# Patient Record
Sex: Female | Born: 1995 | Race: Black or African American | Hispanic: No | Marital: Single | Smoking: Never smoker
Health system: Southern US, Community
[De-identification: ages and names within clinical notes are randomized; demographics above are authoritative.]

## PROBLEM LIST (undated history)

## (undated) DIAGNOSIS — F419 Anxiety disorder, unspecified: Secondary | ICD-10-CM

## (undated) HISTORY — DX: Anxiety disorder, unspecified: F41.9

## (undated) HISTORY — PX: KNEE SURGERY: SHX244

---

## 2016-11-27 ENCOUNTER — Ambulatory Visit (INDEPENDENT_AMBULATORY_CARE_PROVIDER_SITE_OTHER): Payer: BLUE CROSS/BLUE SHIELD | Admitting: Student

## 2016-11-27 DIAGNOSIS — N83209 Unspecified ovarian cyst, unspecified side: Secondary | ICD-10-CM | POA: Diagnosis not present

## 2016-11-27 DIAGNOSIS — R102 Pelvic and perineal pain: Secondary | ICD-10-CM

## 2016-11-27 LAB — POCT URINALYSIS DIP (MANUAL ENTRY)
BILIRUBIN UA: NEGATIVE
Bilirubin, UA: NEGATIVE
GLUCOSE UA: NEGATIVE
LEUKOCYTES UA: NEGATIVE
Nitrite, UA: NEGATIVE
PROTEIN UA: NEGATIVE
Spec Grav, UA: 1.02 (ref 1.030–1.035)
Urobilinogen, UA: 0.2 (ref ?–2.0)
pH, UA: 7 (ref 5.0–8.0)

## 2016-11-27 LAB — POCT URINE PREGNANCY: Preg Test, Ur: NEGATIVE

## 2016-11-27 MED ORDER — HYDROCODONE-ACETAMINOPHEN 5-325 MG PO TABS: 1.0000 | ORAL_TABLET | Freq: Four times a day (QID) | ORAL | 0 refills | Status: DC | PRN

## 2016-11-27 NOTE — Progress Notes (Signed)
   Subjective:    Patient ID: Meredith Price, female    DOB: 04/12/1996, 21 y.o.   MRN: [id number]  HPI Complains of pelvic pain for 2 days.  LMP 11/13/16.  Sexually active with 1 partner, no concern for STD's, not concerned for pregnancy.  Is not concerned for pregnancy.  Has a history of ovarian cysts, has an OBGYN in [place] that was treating her for that last in July.  Feels the same as before.  Having a lot of pain in cycles.  Denies vaginal bleeding, discharge, dysuria.   PMHx - Updated and reviewed.  Contributory factors include: ovarian cysts PSHx - Updated and reviewed.  Contributory factors include:  Negative FHx - Updated and reviewed.  Contributory factors include:  Negative Social Hx - Updated and reviewed. Contributory factors include: [employer] student, from [place] Medications - reviewed   Review of Systems  Constitutional: Negative for chills, fatigue and fever.  Respiratory: Negative for cough, chest tightness and shortness of breath.   Cardiovascular: Negative for chest pain and leg swelling.  Gastrointestinal: Negative for abdominal pain and nausea.  Genitourinary: Positive for pelvic pain. Negative for difficulty urinating, dysuria, enuresis, frequency, hematuria, urgency, vaginal bleeding, vaginal discharge and vaginal pain.  Musculoskeletal: Negative for arthralgias and joint swelling.  Skin: Negative for rash and wound.  Neurological: Positive for dizziness and light-headedness. Negative for numbness.  Psychiatric/Behavioral: Negative for agitation and confusion.  All other systems reviewed and are negative.      Objective:   Physical Exam  Constitutional: She is oriented to person, place, and time. She appears well-developed and well-nourished. No distress.  HENT:  Head: Normocephalic and atraumatic.  Right Ear: External ear normal.  Left Ear: External ear normal.  Neck: Normal range of motion. Neck supple.  Pulmonary/Chest: Effort normal. No respiratory distress.    Genitourinary: Vagina normal and uterus normal. No vaginal discharge found.  Genitourinary Comments: TTP over right ovary, but no mass palpated.  No CMT.    Musculoskeletal: Normal range of motion. She exhibits no edema.  Neurological: She is alert and oriented to person, place, and time.  Skin: Skin is warm. No rash noted. She is not diaphoretic. No erythema.  Psychiatric: She has a normal mood and affect. Her behavior is normal. Judgment and thought content normal.  Nursing note and vitals reviewed.  BP 125/85   Pulse 87   Temp 98.9 F (37.2 C) (Oral)   Resp 18   Ht 5\' 4"  (1.626 m)   Wt 221 lb (100.2 kg)   LMP 11/17/2016   SpO2 100%   BMI 37.93 kg/m         Assessment & Plan:  Pelvic pain Pregnancy test, UA, pelvic ultrasound to check for cysts.  Ovarian cyst Will get ultrasound to check.  Would follow up with OBGYN at home as well if this keeps coming back.  Signed,  Corliss MarcusAlicia Barnes, DO [facility] 5:46 PM

## 2016-11-27 NOTE — Assessment & Plan Note (Addendum)
Pregnancy test, UA, pelvic ultrasound to check for cysts.

## 2016-11-27 NOTE — Assessment & Plan Note (Addendum)
Will get ultrasound to check.  Would follow up with OBGYN at home as well if this keeps coming back.

## 2016-11-27 NOTE — Patient Instructions (Addendum)
  [facility] 3/30 at 3:30    IF you received an x-ray today, you will receive an invoice from [facility]. Please contact [facility] at [phone] with questions or concerns regarding your invoice.   IF you received labwork today, you will receive an invoice from [facility]. Please contact [facility] at [phone] with questions or concerns regarding your invoice.   Our billing staff will not be able to assist you with questions regarding bills from these companies.  You will be contacted with the lab results as soon as they are available. The fastest way to get your results is to activate your My Chart account. Instructions are located on the last page of this paperwork. If you have not heard from us regarding the results in 2 weeks, please contact this office.

## 2016-11-30 ENCOUNTER — Ambulatory Visit (HOSPITAL_COMMUNITY): Payer: BLUE CROSS/BLUE SHIELD

## 2016-12-17 ENCOUNTER — Ambulatory Visit
Admission: RE | Admit: 2016-12-17 | Discharge: 2016-12-17 | Disposition: A | Discharge status: Home / Self Care | Payer: BLUE CROSS/BLUE SHIELD | Source: Ambulatory Visit | Attending: Student | Admitting: Student

## 2016-12-17 DIAGNOSIS — R102 Pelvic and perineal pain: Secondary | ICD-10-CM

## 2016-12-17 DIAGNOSIS — N83209 Unspecified ovarian cyst, unspecified side: Secondary | ICD-10-CM

## 2017-10-09 DIAGNOSIS — J019 Acute sinusitis, unspecified: Secondary | ICD-10-CM | POA: Diagnosis not present

## 2017-10-09 DIAGNOSIS — B9689 Other specified bacterial agents as the cause of diseases classified elsewhere: Secondary | ICD-10-CM | POA: Diagnosis not present

## 2017-10-09 DIAGNOSIS — R05 Cough: Secondary | ICD-10-CM | POA: Diagnosis not present

## 2017-11-04 DIAGNOSIS — Z113 Encounter for screening for infections with a predominantly sexual mode of transmission: Secondary | ICD-10-CM | POA: Diagnosis not present

## 2017-11-04 DIAGNOSIS — Z3009 Encounter for other general counseling and advice on contraception: Secondary | ICD-10-CM | POA: Diagnosis not present

## 2018-01-14 DIAGNOSIS — H60391 Other infective otitis externa, right ear: Secondary | ICD-10-CM | POA: Diagnosis not present

## 2018-03-17 DIAGNOSIS — Z3202 Encounter for pregnancy test, result negative: Secondary | ICD-10-CM | POA: Diagnosis not present

## 2018-03-17 DIAGNOSIS — Z30430 Encounter for insertion of intrauterine contraceptive device: Secondary | ICD-10-CM | POA: Diagnosis not present

## 2018-04-28 DIAGNOSIS — Z6838 Body mass index (BMI) 38.0-38.9, adult: Secondary | ICD-10-CM | POA: Diagnosis not present

## 2018-04-28 DIAGNOSIS — M79672 Pain in left foot: Secondary | ICD-10-CM | POA: Diagnosis not present

## 2018-04-28 DIAGNOSIS — M25561 Pain in right knee: Secondary | ICD-10-CM | POA: Diagnosis not present

## 2018-04-29 DIAGNOSIS — Z30431 Encounter for routine checking of intrauterine contraceptive device: Secondary | ICD-10-CM | POA: Diagnosis not present

## 2018-05-15 DIAGNOSIS — Z3202 Encounter for pregnancy test, result negative: Secondary | ICD-10-CM | POA: Diagnosis not present

## 2018-05-15 DIAGNOSIS — R102 Pelvic and perineal pain: Secondary | ICD-10-CM | POA: Diagnosis not present

## 2018-05-15 DIAGNOSIS — N926 Irregular menstruation, unspecified: Secondary | ICD-10-CM | POA: Diagnosis not present

## 2018-08-18 DIAGNOSIS — B349 Viral infection, unspecified: Secondary | ICD-10-CM | POA: Diagnosis not present

## 2018-08-18 DIAGNOSIS — L239 Allergic contact dermatitis, unspecified cause: Secondary | ICD-10-CM | POA: Diagnosis not present

## 2018-11-11 DIAGNOSIS — Z113 Encounter for screening for infections with a predominantly sexual mode of transmission: Secondary | ICD-10-CM | POA: Diagnosis not present

## 2018-11-11 DIAGNOSIS — Z01419 Encounter for gynecological examination (general) (routine) without abnormal findings: Secondary | ICD-10-CM | POA: Diagnosis not present

## 2018-11-11 DIAGNOSIS — Z13220 Encounter for screening for lipoid disorders: Secondary | ICD-10-CM | POA: Diagnosis not present

## 2018-12-26 IMAGING — US US TRANSVAGINAL NON-OB
1 series · 14 of 25 positions shown · non-contrast
Comparison: No prior.

CLINICAL DATA: Pelvic pain.  History of ovarian cysts.

EXAM:
TRANSABDOMINAL AND TRANSVAGINAL ULTRASOUND OF PELVIS
TECHNIQUE: Both transabdominal and transvaginal ultrasound examinations of the
pelvis were performed. Transabdominal technique was performed for
global imaging of the pelvis including uterus, ovaries, adnexal
regions, and pelvic cul-de-sac. It was necessary to proceed with
endovaginal exam following the transabdominal exam to visualize the
uterus and ovaries.

[Series 1: us transvaginal non-ob · 0.19mm/px · 14 of 68 slices shown]
[im 1/68]
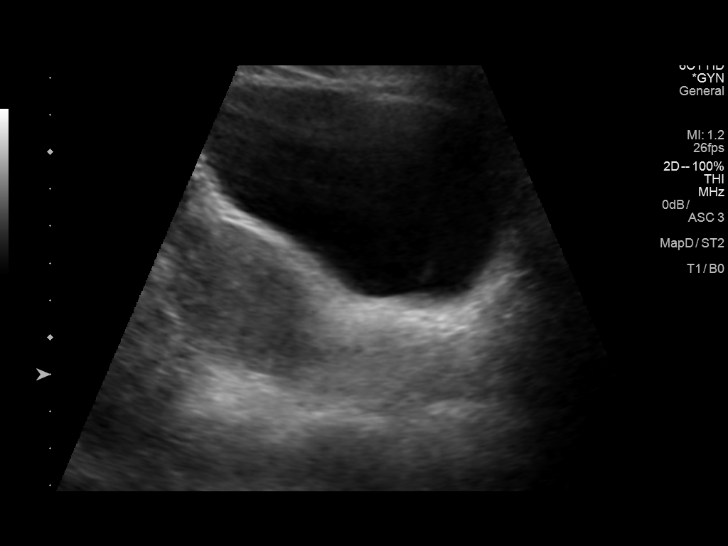
[im 6/68]
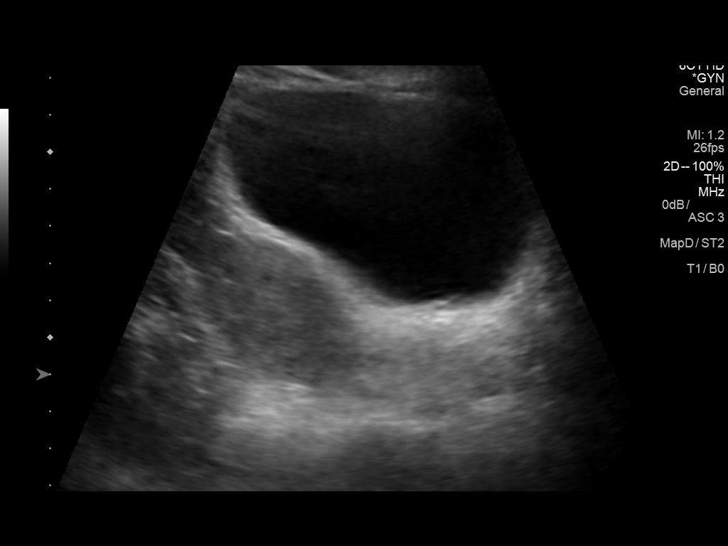
[im 12/68]
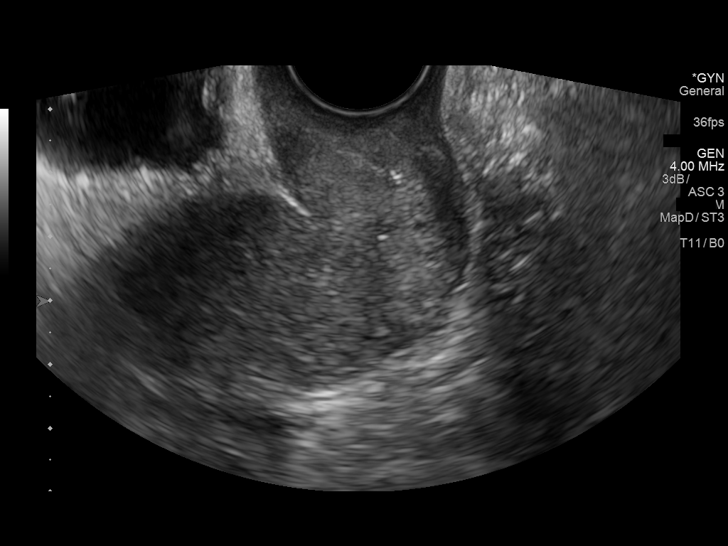
[im 17/68]
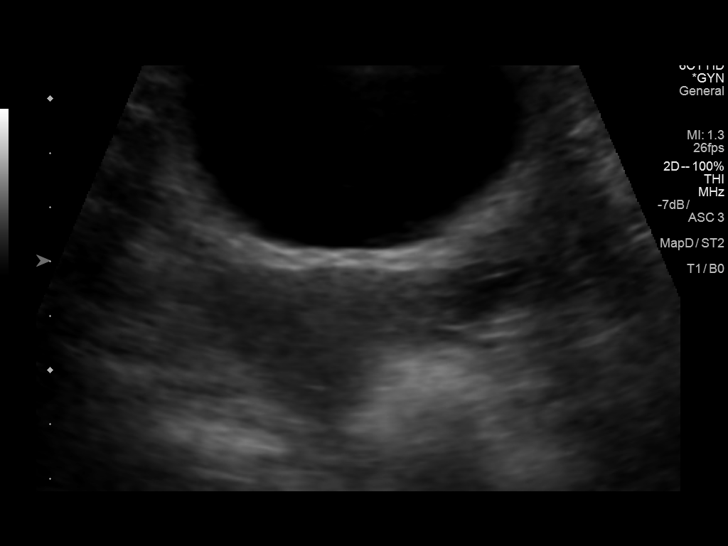
[im 23/68]
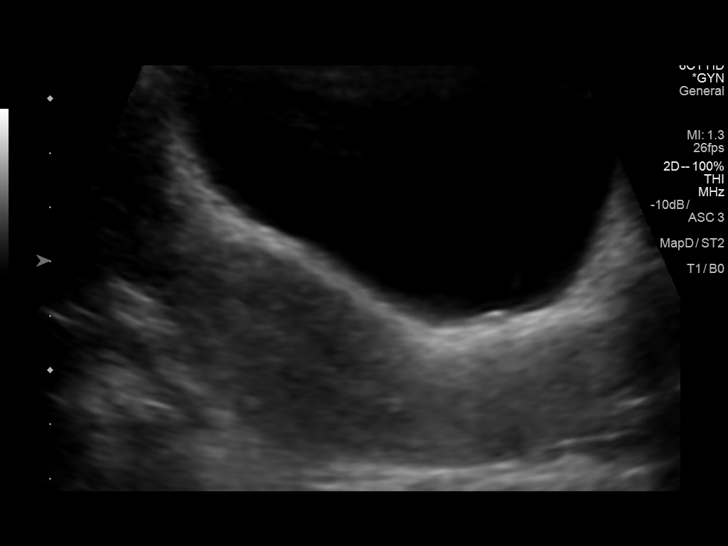
[im 26/68]
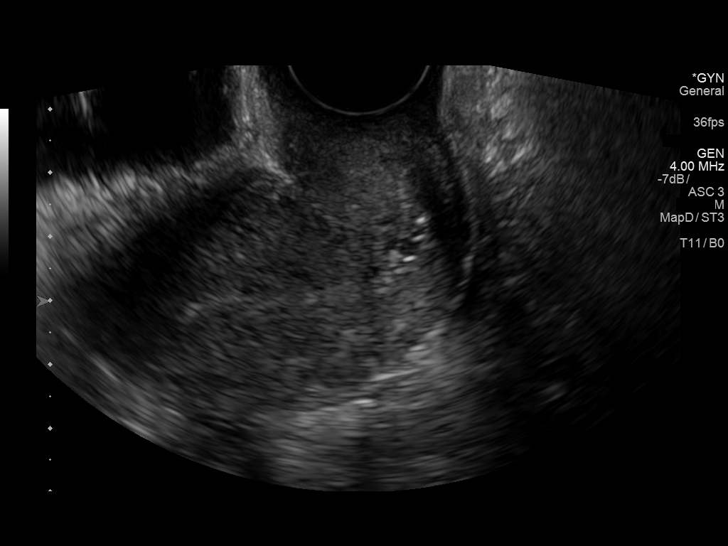
[im 31/68]
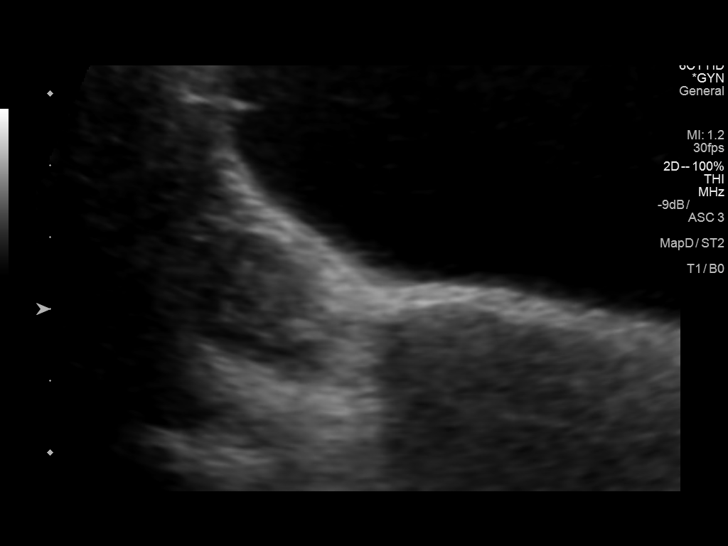
[im 37/68]
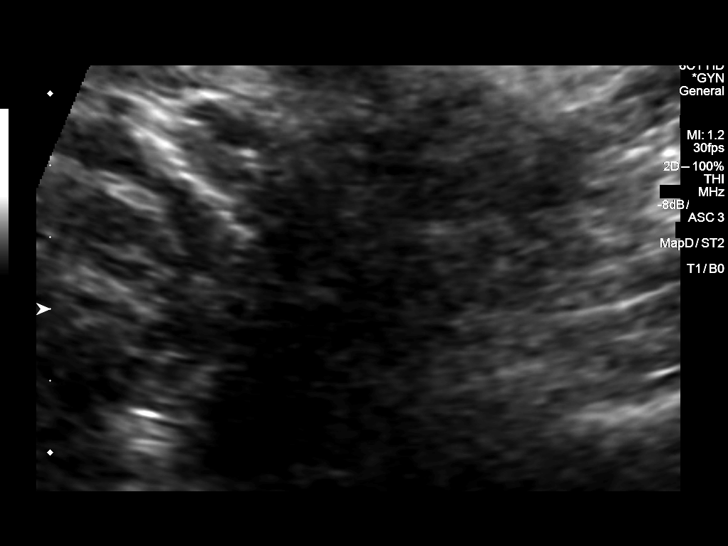
[im 42/68]
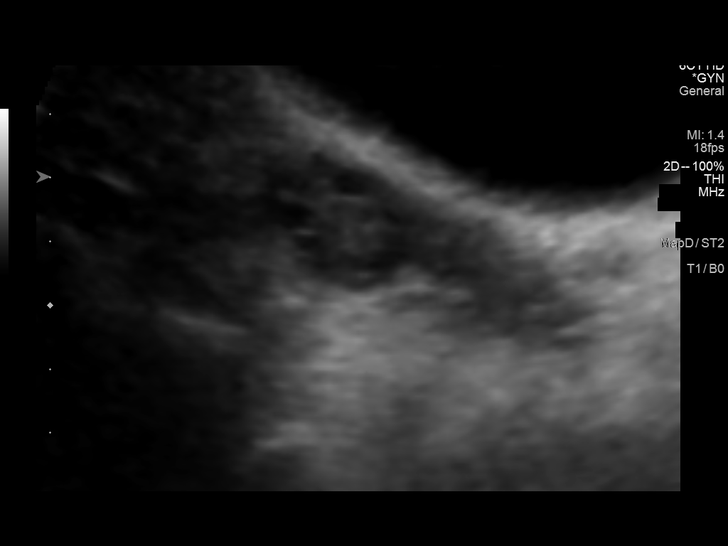
[im 45/68]
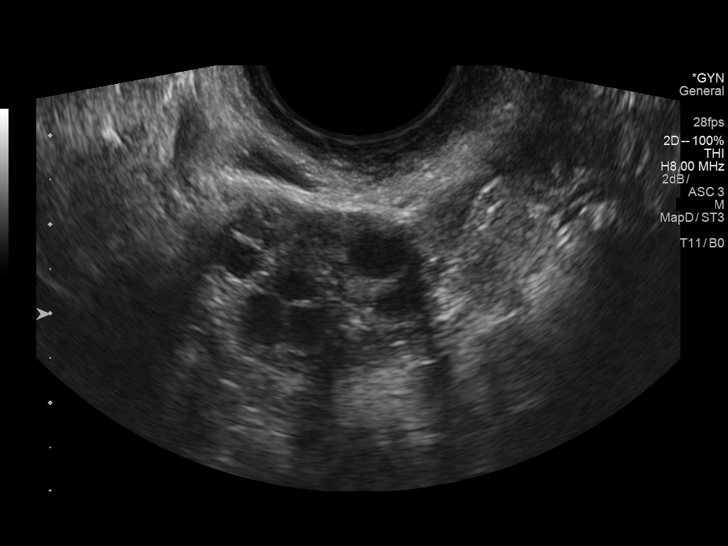
[im 51/68]
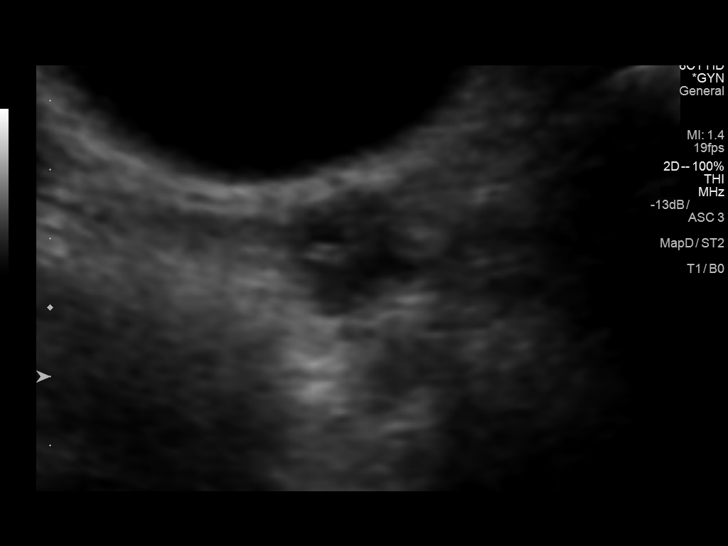
[im 56/68]
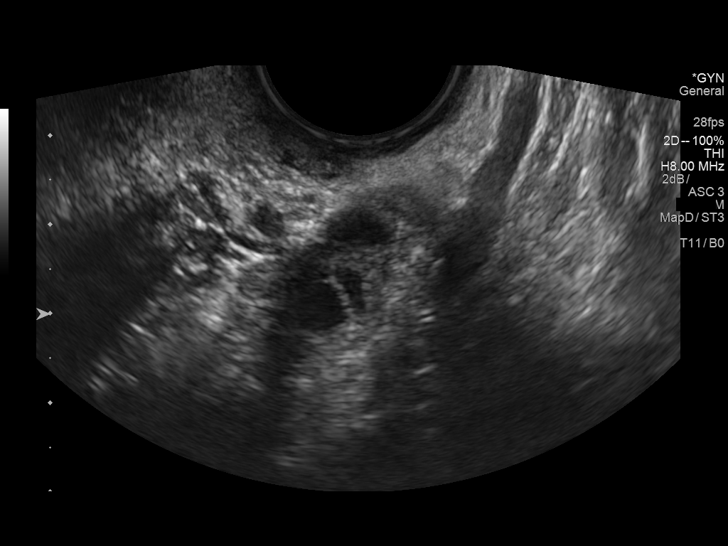
[im 62/68]
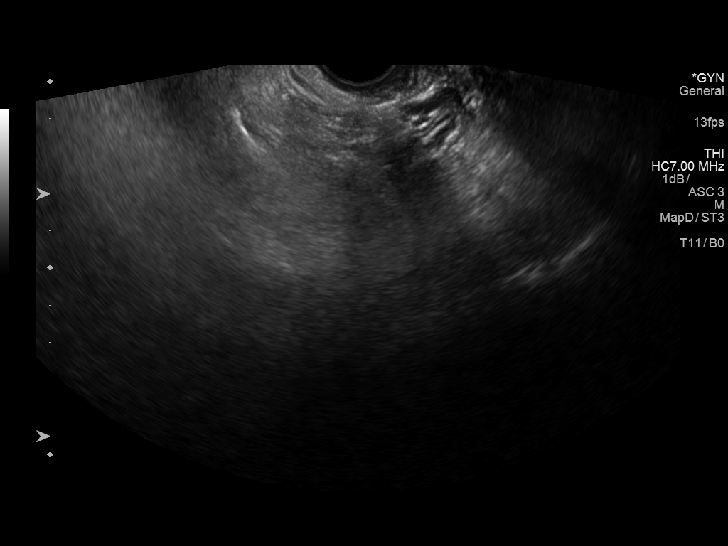
[im 68/68]
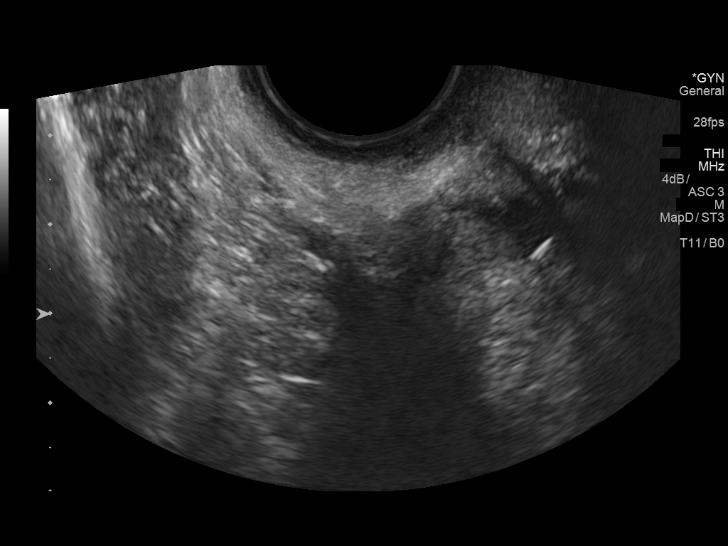

[14 of 25 positions shown; findings below may reference images not displayed]

FINDINGS: Uterus

Measurements: 8.2 x 3.3 x 4.6 cm. No fibroids or other mass
visualized.

Endometrium

Thickness: 4.3 mm.  No focal abnormality visualized.

Right ovary

Measurements: 2.6 x 1.5 x 1.7 cm. Normal appearance/no adnexal mass.

Left ovary

Measurements: 2.6 x 2.0 x 1.6 cm. Normal appearance/no adnexal mass.

Other findings

No abnormal free fluid.
IMPRESSION: No acute or focal abnormality. No evidence of significant ovarian
cyst. No free pelvic fluid.

## 2019-01-07 DIAGNOSIS — H60502 Unspecified acute noninfective otitis externa, left ear: Secondary | ICD-10-CM | POA: Diagnosis not present

## 2019-01-10 DIAGNOSIS — H60502 Unspecified acute noninfective otitis externa, left ear: Secondary | ICD-10-CM | POA: Diagnosis not present

## 2019-01-10 DIAGNOSIS — H6692 Otitis media, unspecified, left ear: Secondary | ICD-10-CM | POA: Diagnosis not present

## 2019-01-11 DIAGNOSIS — H6692 Otitis media, unspecified, left ear: Secondary | ICD-10-CM | POA: Diagnosis not present

## 2019-01-20 DIAGNOSIS — H60333 Swimmer's ear, bilateral: Secondary | ICD-10-CM | POA: Diagnosis not present

## 2019-03-02 DIAGNOSIS — F331 Major depressive disorder, recurrent, moderate: Secondary | ICD-10-CM | POA: Diagnosis not present

## 2019-03-12 DIAGNOSIS — F331 Major depressive disorder, recurrent, moderate: Secondary | ICD-10-CM | POA: Diagnosis not present

## 2019-04-01 DIAGNOSIS — R3989 Other symptoms and signs involving the genitourinary system: Secondary | ICD-10-CM | POA: Diagnosis not present

## 2019-04-01 DIAGNOSIS — R1030 Lower abdominal pain, unspecified: Secondary | ICD-10-CM | POA: Diagnosis not present

## 2019-04-01 DIAGNOSIS — R102 Pelvic and perineal pain: Secondary | ICD-10-CM | POA: Diagnosis not present

## 2019-04-03 DIAGNOSIS — F331 Major depressive disorder, recurrent, moderate: Secondary | ICD-10-CM | POA: Diagnosis not present

## 2019-04-23 DIAGNOSIS — F331 Major depressive disorder, recurrent, moderate: Secondary | ICD-10-CM | POA: Diagnosis not present

## 2019-05-14 DIAGNOSIS — F331 Major depressive disorder, recurrent, moderate: Secondary | ICD-10-CM | POA: Diagnosis not present

## 2019-05-29 DIAGNOSIS — Z20828 Contact with and (suspected) exposure to other viral communicable diseases: Secondary | ICD-10-CM | POA: Diagnosis not present

## 2019-06-02 DIAGNOSIS — F331 Major depressive disorder, recurrent, moderate: Secondary | ICD-10-CM | POA: Diagnosis not present

## 2019-06-10 ENCOUNTER — Encounter: Payer: Self-pay | Admitting: Family Medicine

## 2019-06-10 ENCOUNTER — Ambulatory Visit (INDEPENDENT_AMBULATORY_CARE_PROVIDER_SITE_OTHER): Payer: BLUE CROSS/BLUE SHIELD | Admitting: Family Medicine

## 2019-06-10 ENCOUNTER — Other Ambulatory Visit: Payer: Self-pay

## 2019-06-10 VITALS — BP 122/83 | HR 81 | Temp 98.8°F | Resp 17 | Ht 63.0 in | Wt 240.6 lb

## 2019-06-10 DIAGNOSIS — F419 Anxiety disorder, unspecified: Secondary | ICD-10-CM

## 2019-06-10 DIAGNOSIS — Z0000 Encounter for general adult medical examination without abnormal findings: Secondary | ICD-10-CM

## 2019-06-10 DIAGNOSIS — F32A Depression, unspecified: Secondary | ICD-10-CM

## 2019-06-10 DIAGNOSIS — F329 Major depressive disorder, single episode, unspecified: Secondary | ICD-10-CM

## 2019-06-10 DIAGNOSIS — Z299 Encounter for prophylactic measures, unspecified: Secondary | ICD-10-CM

## 2019-06-10 DIAGNOSIS — R0789 Other chest pain: Secondary | ICD-10-CM

## 2019-06-10 DIAGNOSIS — R7301 Impaired fasting glucose: Secondary | ICD-10-CM

## 2019-06-10 DIAGNOSIS — Z23 Encounter for immunization: Secondary | ICD-10-CM

## 2019-06-10 MED ORDER — FAMOTIDINE 20 MG PO TABS: 20.0000 mg | ORAL_TABLET | Freq: Two times a day (BID) | ORAL | 3 refills | Status: AC

## 2019-06-10 MED ORDER — ESCITALOPRAM OXALATE 10 MG PO TABS: 10.0000 mg | ORAL_TABLET | Freq: Every day | ORAL | 3 refills | Status: DC

## 2019-06-10 NOTE — Patient Instructions (Addendum)
  1. Take lexapro 5mg  (1/2 tablet for a week) then increase to a full tablet after that. 2. Continue with exercise and counseling 3. Eat more frequent meals but make them smaller. 4. Take pepcid (famotidine) for reflux   If you have lab work done today you will be contacted with your lab results within the next 2 weeks.  If you have not heard from [place] then please contact us. The fastest way to get your results is to register for My Chart.   IF you received an x-ray today, you will receive an invoice from [facility]. Please contact [facility] at [phone] with questions or concerns regarding your invoice.   IF you received labwork today, you will receive an invoice from [facility]. Please contact [facility] at [phone] with questions or concerns regarding your invoice.   Our billing staff will not be able to assist you with questions regarding bills from these companies.  You will be contacted with the lab results as soon as they are available. The fastest way to get your results is to activate your My Chart account. Instructions are located on the last page of this paperwork. If you have not heard from [place] regarding the results in 2 weeks, please contact this office.

## 2019-06-10 NOTE — Progress Notes (Signed)
New Patient Office Visit  Subjective:  Patient ID: Meredith Price, female    DOB: [birth year]  Age: 23 y.o. MRN: [id number]  CC:  Chief Complaint  Patient presents with  . New Patient (Initial Visit)    establish care.  Pt c/o panic attacks x 3 weeks, attacks last approx.mins and she does have cp with attacks with a pain level of /  . Depression    score ; 9    HPI Meredith Price presents for   Anxiety and Depression patient is in school working on her master's degree in mental health counseling She states that her intership is in the office with classes online She has been going to counseling since senior year of college She is continuing in counseling and goes every 2-3 weeks She has the paraguard IUD She denies suicidal thoughts or attempts  She gets panic attacks that is happening since the past 3 weeks in 1 week intervals lasting 5-10 minutes Her symptoms feel like her chest is closing in and she can't breathe She feels tightness in her shoulder or back Her panic attacks happen more at night and she would not rest well due to fear she would not wake bck up She sleeps 6-7 hours at night No alcohol use No smoking of marijuana No family history of mental health She exercises with a personal trainer  Depression screen [facility] 2/9 06/10/2019 11/27/2016  Decreased Interest 1 0  Down, Depressed, Hopeless 1 0  PHQ - 2 Score 2 0  Altered sleeping 2 -  Tired, decreased energy 3 -  Change in appetite 0 -  Feeling bad or failure about yourself  0 -  Trouble concentrating 2 -  Moving slowly or fidgety/restless 0 -  Suicidal thoughts 0 -  PHQ-9 Score 9 -  Difficult doing work/chores Somewhat difficult -   GAD 7 : Generalized Anxiety Score 06/10/2019  Nervous, Anxious, on Edge 2  Control/stop worrying 3  Worry too much - different things 3  Trouble relaxing 3  Restless 1  Easily annoyed or irritable 1  Afraid - awful might happen 2  Total GAD 7 Score 15  Anxiety Difficulty  Not difficult at all    Chest tightness She only eats 3 meals a day She states that she was taking appetite suppressants in the past She feels like she would need something to help with her weight loss  Morbid obesity She weighed 218# a year ago with a gradual weight gain. She stopped going to the gym and gained weight. Since covid she was on a break from her lifestyle. Body mass index is 42.62 kg/m.  Health Maintenance Pap smear done at [facility] March 2020 STD testing done at [facility] March 2020 Exercises 4-5 times a week for one  Hour   Past Medical History:  Diagnosis Date  . Anxiety     Past Surgical History:  Procedure Laterality Date  . KNEE SURGERY      Family History  Problem Relation Age of Onset  . Hyperlipidemia Mother   . Hyperlipidemia Father   . Heart disease Maternal Grandmother     Social History   Socioeconomic History  . Marital status: Single    Spouse name: Not on file  . Number of children: Not on file  . Years of education: Not on file  . Highest education level: Not on file  Occupational History  . Occupation: Ship broker  Social Needs  . Financial resource strain: Not hard at  all  . Food insecurity    Worry: Never true    Inability: Never true  . Transportation needs    Medical: No    Non-medical: No  Tobacco Use  . Smoking status: Never Smoker  . Smokeless tobacco: Never Used  Substance and Sexual Activity  . Alcohol use: Yes  . Drug use: Not Currently  . Sexual activity: Yes    Birth control/protection: I.U.D.    Comment: paraguard  Lifestyle  . Physical activity    Days per week: 4 days    Minutes per session: 60 min  . Stress: Very much  Relationships  . Social connections    Talks on phone: More than three times a week    Gets together: Once a week    Attends religious service: More than 4 times per year    Active member of club or organization: No    Attends meetings of clubs or organizations: Never    Relationship status:  Never married  . Intimate partner violence    Fear of current or ex partner: No    Emotionally abused: No    Physically abused: No    Forced sexual activity: No  Other Topics Concern  . Not on file  Social History Narrative  . Not on file    ROS Review of Systems  Constitutional: Negative for activity change, appetite change, chills and diaphoresis.  HENT: Negative for congestion.   Respiratory: Positive for chest tightness. Negative for cough, choking, shortness of breath and stridor.   Cardiovascular: Negative for chest pain, palpitations and leg swelling.  Gastrointestinal: Negative for abdominal distention, abdominal pain, anal bleeding and blood in stool.  Endocrine: Negative for cold intolerance and heat intolerance.  Neurological: Negative for dizziness, facial asymmetry, light-headedness, numbness and headaches.    Objective:   Today's Vitals: BP 122/83 (BP Location: Left Arm, Patient Position: Sitting, Cuff Size: Large)   Pulse 81   Temp 98.8 F (37.1 C) (Oral)   Resp 17   Ht 5\' 3"  (1.6 m)   Wt 240 lb 9.6 oz (109.1 kg)   LMP 05/20/2019   SpO2 99%   BMI 42.62 kg/m   Physical Exam Constitutional:      Appearance: Normal appearance. She is obese.  HENT:     Head: Normocephalic and atraumatic.     Right Ear: Tympanic membrane and ear canal normal.     Left Ear: Tympanic membrane and ear canal normal.  Eyes:     Extraocular Movements: Extraocular movements intact.     Conjunctiva/sclera: Conjunctivae normal.  Neck:     Musculoskeletal: Normal range of motion and neck supple.  Cardiovascular:     Rate and Rhythm: Normal rate and regular rhythm.     Pulses: Normal pulses.     Heart sounds: No murmur.  Pulmonary:     Effort: Pulmonary effort is normal. No respiratory distress.     Breath sounds: Normal breath sounds. No stridor. No wheezing, rhonchi or rales.  Chest:     Chest wall: No tenderness.  Abdominal:     General: Abdomen is flat.     Palpations:  Abdomen is soft.  Musculoskeletal: Normal range of motion.        General: No swelling or tenderness.  Skin:    General: Skin is warm.  Neurological:     General: No focal deficit present.     Mental Status: She is alert and oriented to person, place, and time.  Psychiatric:  Mood and Affect: Mood normal.        Behavior: Behavior normal.        Thought Content: Thought content normal.        Judgment: Judgment normal.    EKG- nsr, no twi  Assessment & Plan:   Problem List Items Addressed This Visit    None    Visit Diagnoses    Routine health maintenance    -  Primary Women's Health Maintenance Plan Advised monthly breast exam and annual mammogram Advised dental exam every six months Discussed stress management Discussed pap smear screening guidelines    Relevant Orders   Comprehensive metabolic panel   Need for prophylactic measure       Need for prophylactic vaccination and inoculation against influenza       Anxiety and depression    -  Discussed treatment for depression and anxiety Continue with counseling Discussed exercise Discussed treatment expectations   Relevant Medications   escitalopram (LEXAPRO) 10 MG tablet   Other Relevant Orders   Comprehensive metabolic panel   TSH   CBC   Chest tightness    - could be related to reflux as well EKG wnl   Relevant Orders   EKG 12-Lead (Completed)   Impaired fasting glucose    - discussed glucose metabolism and weight loss    Relevant Orders   Hemoglobin A1c      Outpatient Encounter Medications as of 06/10/2019  Medication Sig  . PARAGARD INTRAUTERINE COPPER IU by Intrauterine route.  Marland Kitchen. escitalopram (LEXAPRO) 10 MG tablet Take 1 tablet (10 mg total) by mouth daily.  . famotidine (PEPCID) 20 MG tablet Take 1 tablet (20 mg total) by mouth 2 (two) times daily.  . [DISCONTINUED] HYDROcodone-acetaminophen (NORCO) 5-325 MG tablet Take 1 tablet by mouth every 6 (six) hours as needed for moderate pain.  .  [DISCONTINUED] levonorgestrel-ethinyl estradiol (AVIANE,ALESSE,LESSINA) 0.1-20 MG-MCG tablet Take 1 tablet by mouth daily.   No facility-administered encounter medications on file as of 06/10/2019.     Follow-up: Return in about 3 months (around 09/10/2019) for anxiety and depression .   A total of 50 minutes were spent face-to-face with the patient during this encounter and over half of that time was spent on counseling and coordination of care.  Doristine BosworthZoe A [place] Bowlds, MD

## 2019-06-11 LAB — COMPREHENSIVE METABOLIC PANEL
ALT: 10 IU/L (ref 0–32)
AST: 17 IU/L (ref 0–40)
Albumin/Globulin Ratio: 1.3 (ref 1.2–2.2)
Albumin: 4 g/dL (ref 3.9–5.0)
Alkaline Phosphatase: 131 IU/L — ABNORMAL HIGH (ref 39–117)
BUN/Creatinine Ratio: 16 (ref 9–23)
BUN: 11 mg/dL (ref 6–20)
Bilirubin Total: 0.5 mg/dL (ref 0.0–1.2)
CO2: 23 mmol/L (ref 20–29)
Calcium: 9.7 mg/dL (ref 8.7–10.2)
Chloride: 99 mmol/L (ref 96–106)
Creatinine, Ser: 0.7 mg/dL (ref 0.57–1.00)
GFR calc Af Amer: 140 mL/min/{1.73_m2} (ref >59)
GFR calc non Af Amer: 122 mL/min/{1.73_m2} (ref >59)
Globulin, Total: 3.1 g/dL (ref 1.5–4.5)
Glucose: 99 mg/dL (ref 65–99)
Potassium: 4.4 mmol/L (ref 3.5–5.2)
Sodium: 135 mmol/L (ref 134–144)
Total Protein: 7.1 g/dL (ref 6.0–8.5)

## 2019-06-11 LAB — TSH: TSH: 1.64 u[IU]/mL (ref 0.450–4.500)

## 2019-06-11 LAB — CBC
Hematocrit: 36 % (ref 34.0–46.6)
Hemoglobin: 11.6 g/dL (ref 11.1–15.9)
MCH: 27.2 pg (ref 26.6–33.0)
MCHC: 32.2 g/dL (ref 31.5–35.7)
MCV: 85 fL (ref 79–97)
Platelets: 363 10*3/uL (ref 150–450)
RBC: 4.26 x10E6/uL (ref 3.77–5.28)
RDW: 11.7 % (ref 11.7–15.4)
WBC: 10.7 10*3/uL (ref 3.4–10.8)

## 2019-06-11 LAB — HEMOGLOBIN A1C
Est. average glucose Bld gHb Est-mCnc: 94 mg/dL
Hgb A1c MFr Bld: 4.9 % (ref 4.8–5.6)

## 2019-06-17 ENCOUNTER — Encounter: Payer: Self-pay | Admitting: Family Medicine

## 2019-06-26 DIAGNOSIS — F331 Major depressive disorder, recurrent, moderate: Secondary | ICD-10-CM | POA: Diagnosis not present

## 2019-07-24 DIAGNOSIS — F331 Major depressive disorder, recurrent, moderate: Secondary | ICD-10-CM | POA: Diagnosis not present

## 2019-08-13 DIAGNOSIS — F331 Major depressive disorder, recurrent, moderate: Secondary | ICD-10-CM | POA: Diagnosis not present

## 2019-09-11 ENCOUNTER — Encounter: Payer: Self-pay | Admitting: Family Medicine

## 2019-09-11 ENCOUNTER — Other Ambulatory Visit: Payer: Self-pay

## 2019-09-11 ENCOUNTER — Telehealth (INDEPENDENT_AMBULATORY_CARE_PROVIDER_SITE_OTHER): Payer: BLUE CROSS/BLUE SHIELD | Admitting: Family Medicine

## 2019-09-11 VITALS — Ht 63.0 in

## 2019-09-11 DIAGNOSIS — F419 Anxiety disorder, unspecified: Secondary | ICD-10-CM

## 2019-09-11 DIAGNOSIS — F329 Major depressive disorder, single episode, unspecified: Secondary | ICD-10-CM | POA: Diagnosis not present

## 2019-09-11 DIAGNOSIS — F32A Depression, unspecified: Secondary | ICD-10-CM

## 2019-09-11 MED ORDER — ESCITALOPRAM OXALATE 10 MG PO TABS: 10.0000 mg | ORAL_TABLET | Freq: Every day | ORAL | 3 refills | Status: DC

## 2019-09-11 MED ORDER — BUPROPION HCL ER (SR) 100 MG PO TB12: 100.0000 mg | ORAL_TABLET | Freq: Every day | ORAL | 0 refills | Status: DC

## 2019-09-11 NOTE — Patient Instructions (Addendum)
° ° ° °  If you have lab work done today you will be contacted with your lab results within the next 2 weeks.  If you have not heard from us then please contact us. The fastest way to get your results is to register for My Chart. ° ° °IF you received an x-ray today, you will receive an invoice from [facility]. Please contact [facility] at [phone] with questions or concerns regarding your invoice.  ° °IF you received labwork today, you will receive an invoice from [facility]. Please contact [facility] at [phone] with questions or concerns regarding your invoice.  ° °Our billing staff will not be able to assist you with questions regarding bills from these companies. ° °You will be contacted with the lab results as soon as they are available. The fastest way to get your results is to activate your My Chart account. Instructions are located on the last page of this paperwork. If you have not heard from us regarding the results in 2 weeks, please contact this office. °  ° ° ° °

## 2019-09-11 NOTE — Progress Notes (Signed)
DOXIMITY VIDEO Encounter- SOAP NOTE Established Patient  This VIDEO encounter was conducted with the patient's (or proxy's) verbal consent via audio telecommunications: yes/no: Yes Patient was instructed to have this encounter in a suitably private space; and to only have persons present to whom they give permission to participate. In addition, patient identity was confirmed by use of name plus two identifiers (DOB and address).  I discussed the limitations, risks, security and privacy concerns of performing an evaluation and management service by telephone and the availability of in person appointments. I also discussed with the patient that there may be a patient responsible charge related to this service. The patient expressed understanding and agreed to proceed.  I spent a total of TIME; 0 MIN TO 60 MIN: 15 minutes talking with the patient or their proxy.  Cc: OBESITY  Subjective   Meredith Price is a 24 y.o. established patient. Telephone visit today for  HPI  Obesity Anxiety and Depression She goes to counseling 2-3 times a week She works with a trainer a few times a week She is trying to make healthier food choicers She reports that she worked very long hours studying and would not eat more than 2 meals a day She is also a picky eater She is stressed about her weight but reports that her mood is better Her mood is better overall with the escitalopram   Last visit she had tsh, cbc, cmp, a1c She reports that she is taking a multivitamin to help her mood She denies any menstrual period irregularities She just continues to have fatigue  Wt Readings from Last 3 Encounters:  06/10/19 240 lb 9.6 oz (109.1 kg)  11/27/16 221 lb (100.2 kg)       Patient Active Problem List   Diagnosis Date Noted  . Ovarian cyst 11/27/2016  . Pelvic pain 11/27/2016    Past Medical History:  Diagnosis Date  . Anxiety     Current Outpatient Medications  Medication Sig Dispense  Refill  . escitalopram (LEXAPRO) 10 MG tablet Take 1 tablet (10 mg total) by mouth daily. 90 tablet 3  . famotidine (PEPCID) 20 MG tablet Take 1 tablet (20 mg total) by mouth 2 (two) times daily. 60 tablet 3  . PARAGARD INTRAUTERINE COPPER IU by Intrauterine route.    Marland Kitchen buPROPion (WELLBUTRIN SR) 100 MG 12 hr tablet Take 1 tablet (100 mg total) by mouth daily. 30 tablet 0   No current facility-administered medications for this visit.    No Known Allergies  Social History   Socioeconomic History  . Marital status: Single    Spouse name: Not on file  . Number of children: Not on file  . Years of education: Not on file  . Highest education level: Not on file  Occupational History  . Occupation: Ship broker  Tobacco Use  . Smoking status: Never Smoker  . Smokeless tobacco: Never Used  Substance and Sexual Activity  . Alcohol use: Yes  . Drug use: Not Currently  . Sexual activity: Yes    Birth control/protection: I.U.D.    Comment: paraguard  Other Topics Concern  . Not on file  Social History Narrative  . Not on file   Social Determinants of Health   Financial Resource Strain: Low Risk   . Difficulty of Paying Living Expenses: Not hard at all  Food Insecurity: No Food Insecurity  . Worried About Charity fundraiser in the Last Year: Never true  . Ran Out of  Food in the Last Year: Never true  Transportation Needs: No Transportation Needs  . Lack of Transportation (Medical): No  . Lack of Transportation (Non-Medical): No  Physical Activity: Sufficiently Active  . Days of Exercise per Week: 4 days  . Minutes of Exercise per Session: 60 min  Stress: Stress Concern Present  . Feeling of Stress : Very much  Social Connections: Somewhat Isolated  . Frequency of Communication with Friends and Family: More than three times a week  . Frequency of Social Gatherings with Friends and Family: Once a week  . Attends Religious Services: More than 4 times per year  . Active Member of  Clubs or Organizations: No  . Attends Banker Meetings: Never  . Marital Status: Never married  Intimate Partner Violence: Not At Risk  . Fear of Current or Ex-Partner: No  . Emotionally Abused: No  . Physically Abused: No  . Sexually Abused: No    ROS Review of Systems  Constitutional: Negative for activity change, appetite change, chills and fever.  HENT: Negative for congestion, nosebleeds, trouble swallowing and voice change.   Respiratory: Negative for cough, shortness of breath and wheezing.   Gastrointestinal: Negative for diarrhea, nausea and vomiting.  Genitourinary: Negative for difficulty urinating, dysuria, flank pain and hematuria.  Musculoskeletal: Negative for back pain, joint swelling and neck pain.  Neurological: Negative for dizziness, speech difficulty, light-headedness and numbness.  See HPI. All other review of systems negative.   Objective   Vitals as reported by the patient: Today's Vitals   09/11/19 0837  Height: 5\' 3"  (1.6 m)   Physical Exam  Constitutional: Oriented to person, place, and time. Appears well-developed and well-nourished.  HENT:  Head: Normocephalic and atraumatic.  Eyes: Conjunctivae and EOM are normal.  No murmur heard. Pulmonary/Chest: Effort normal and breath sounds normal Neurological: Is alert and oriented to person, place, and time.  Skin: Skin is warm. Capillary refill takes less than 2 seconds.  Psychiatric: Has a normal mood and affect. Behavior is normal. Judgment and thought content normal.    Diagnoses and all orders for this visit:  Anxiety and depression - discussed emotional eating Will add buproprion to her meds to help with her appetite and mood   Morbid obesity (HCC) - pt will need to download myfitnesspal Will review what patient is eating and plan to refer to nutrition Continue and increase exercise  Other orders -     escitalopram (LEXAPRO) 10 MG tablet; Take 1 tablet (10 mg total) by mouth  daily. -     buPROPion (WELLBUTRIN SR) 100 MG 12 hr tablet; Take 1 tablet (100 mg total) by mouth daily.     I discussed the assessment and treatment plan with the patient. The patient was provided an opportunity to ask questions and all were answered. The patient agreed with the plan and demonstrated an understanding of the instructions.   The patient was advised to call back or seek an in-person evaluation if the symptoms worsen or if the condition fails to improve as anticipated.  I provided 15 minutes of non-face-to-face time during this encounter.  , MD  Primary Care at [facility]

## 2019-09-11 NOTE — Progress Notes (Signed)
A & D screening Done  Over the last 2 weeks, how often have you been  bothered by the following problems?    1.  Feeling nervous, anxious, or on edge   0   2.  Not being able to stop or control worrying   0    3.  Worrying too much about different things   0     4.  Trouble relaxing   0    5.  Being so restless that it's hard to sit still   0    6.  Becoming easily annoyed or irritable   0    7.  Feeling afraid as if something awful might  happen   0      GAD 7 Score=0 PHQ9= 4  Anxiety and depression f/u

## 2019-09-15 DIAGNOSIS — F331 Major depressive disorder, recurrent, moderate: Secondary | ICD-10-CM | POA: Diagnosis not present

## 2019-10-06 DIAGNOSIS — F331 Major depressive disorder, recurrent, moderate: Secondary | ICD-10-CM | POA: Diagnosis not present

## 2019-10-09 ENCOUNTER — Other Ambulatory Visit: Payer: Self-pay | Admitting: Family Medicine

## 2019-10-12 ENCOUNTER — Ambulatory Visit: Payer: BLUE CROSS/BLUE SHIELD | Admitting: Family Medicine

## 2019-10-12 ENCOUNTER — Other Ambulatory Visit: Payer: Self-pay

## 2019-10-12 ENCOUNTER — Encounter: Payer: Self-pay | Admitting: Family Medicine

## 2019-10-12 VITALS — BP 130/88 | HR 105 | Temp 99.0°F | Ht 63.0 in | Wt 255.0 lb

## 2019-10-12 DIAGNOSIS — F329 Major depressive disorder, single episode, unspecified: Secondary | ICD-10-CM | POA: Diagnosis not present

## 2019-10-12 DIAGNOSIS — F419 Anxiety disorder, unspecified: Secondary | ICD-10-CM | POA: Diagnosis not present

## 2019-10-12 DIAGNOSIS — R635 Abnormal weight gain: Secondary | ICD-10-CM | POA: Diagnosis not present

## 2019-10-12 DIAGNOSIS — F32A Depression, unspecified: Secondary | ICD-10-CM

## 2019-10-12 MED ORDER — METFORMIN HCL 500 MG PO TABS: 500.0000 mg | ORAL_TABLET | Freq: Every day | ORAL | 0 refills | Status: DC

## 2019-10-12 NOTE — Progress Notes (Signed)
Established Patient Office Visit  Subjective:  Patient ID: Meredith Price, female    DOB: 03-24-1996  Age: 24 y.o. MRN: [id number]  CC:  Chief Complaint  Patient presents with  . Weight Check    1 m f/u     HPI Meredith Price presents for   Obesity and Weight gain Patient reports that she is not exercising since her trainer has  She is cutting down on fast foods Cutting down on carbs She wants to try keto to help with that She reports that she feels like her energy is low  Wt Readings from Last 3 Encounters:  10/12/19 255 lb (115.7 kg)  06/10/19 240 lb 9.6 oz (109.1 kg)  11/27/16 221 lb (100.2 kg)   Anxiety and Depression  She denies panic attacks since starting lexapro She reports that she no longer feels overwhelmed She could not tolerate wellbutrin because it made her feel like zombie She reports that she continues with her therapist.   Past Medical History:  Diagnosis Date  . Anxiety     Past Surgical History:  Procedure Laterality Date  . KNEE SURGERY      Family History  Problem Relation Age of Onset  . Hyperlipidemia Mother   . Hyperlipidemia Father   . Heart disease Maternal Grandmother     Social History   Socioeconomic History  . Marital status: Single    Spouse name: Not on file  . Number of children: Not on file  . Years of education: Not on file  . Highest education level: Not on file  Occupational History  . Occupation: Ship broker  Tobacco Use  . Smoking status: Never Smoker  . Smokeless tobacco: Never Used  Substance and Sexual Activity  . Alcohol use: Yes  . Drug use: Not Currently  . Sexual activity: Yes    Birth control/protection: I.U.D.    Comment: paraguard  Other Topics Concern  . Not on file  Social History Narrative  . Not on file   Social Determinants of Health   Financial Resource Strain: Low Risk   . Difficulty of Paying Living Expenses: Not hard at all  Food Insecurity: No Food Insecurity  . Worried About Paediatric nurse in the Last Year: Never true  . Ran Out of Food in the Last Year: Never true  Transportation Needs: No Transportation Needs  . Lack of Transportation (Medical): No  . Lack of Transportation (Non-Medical): No  Physical Activity: Sufficiently Active  . Days of Exercise per Week: 4 days  . Minutes of Exercise per Session: 60 min  Stress: Stress Concern Present  . Feeling of Stress : Very much  Social Connections: Somewhat Isolated  . Frequency of Communication with Friends and Family: More than three times a week  . Frequency of Social Gatherings with Friends and Family: Once a week  . Attends Religious Services: More than 4 times per year  . Active Member of Clubs or Organizations: No  . Attends Archivist Meetings: Never  . Marital Status: Never married  Intimate Partner Violence: Not At Risk  . Fear of Current or Ex-Partner: No  . Emotionally Abused: No  . Physically Abused: No  . Sexually Abused: No    Outpatient Medications Prior to Visit  Medication Sig Dispense Refill  . buPROPion (WELLBUTRIN SR) 100 MG 12 hr tablet Take 1 tablet (100 mg total) by mouth daily. 30 tablet 0  . escitalopram (LEXAPRO) 10 MG tablet TAKE 1 TABLET(10  MG) BY MOUTH DAILY 90 tablet 1  . famotidine (PEPCID) 20 MG tablet Take 1 tablet (20 mg total) by mouth 2 (two) times daily. 60 tablet 3  . PARAGARD INTRAUTERINE COPPER IU by Intrauterine route.     No facility-administered medications prior to visit.    No Known Allergies  ROS Review of Systems Review of Systems  Constitutional: Negative for activity change, appetite change, chills and fever.  HENT: Negative for congestion, nosebleeds, trouble swallowing and voice change.   Respiratory: Negative for cough, shortness of breath and wheezing.   Gastrointestinal: Negative for diarrhea, nausea and vomiting.  Genitourinary: Negative for difficulty urinating, dysuria, flank pain and hematuria.  Musculoskeletal: Negative for back  pain, joint swelling and neck pain.  Neurological: Negative for dizziness, speech difficulty, light-headedness and numbness.  See HPI. All other review of systems negative.     Objective:    Physical Exam  BP 130/88   Pulse (!) 105   Temp 99 F (37.2 C) (Temporal)   Ht 5\' 3"  (1.6 m)   Wt 255 lb (115.7 kg)   LMP 09/30/2019   SpO2 95%   BMI 45.17 kg/m  Wt Readings from Last 3 Encounters:  10/12/19 255 lb (115.7 kg)  06/10/19 240 lb 9.6 oz (109.1 kg)  11/27/16 221 lb (100.2 kg)   Physical Exam  Constitutional: Oriented to person, place, and time. Appears well-developed and well-nourished.  HENT:  Head: Normocephalic and atraumatic.  Eyes: Conjunctivae and EOM are normal.  Cardiovascular: Normal rate, regular rhythm, normal heart sounds and intact distal pulses.  No murmur heard. Pulmonary/Chest: Effort normal and breath sounds normal. No stridor. No respiratory distress. Has no wheezes.  Neurological: Is alert and oriented to person, place, and time.  Skin: Skin is warm. Capillary refill takes less than 2 seconds.  Psychiatric: Has a normal mood and affect. Behavior is normal. Judgment and thought content normal.    Health Maintenance Due  Topic Date Due  . HIV Screening  11/04/2009  . PAP-Cervical Cytology Screening  11/05/2015  . PAP SMEAR-Modifier  11/05/2015    There are no preventive care reminders to display for this patient.  Lab Results  Component Value Date   TSH 1.640 06/10/2019   Lab Results  Component Value Date   WBC 10.7 06/10/2019   HGB 11.6 06/10/2019   HCT 36.0 06/10/2019   MCV 85 06/10/2019   PLT 363 06/10/2019   Lab Results  Component Value Date   NA 135 06/10/2019   K 4.4 06/10/2019   CO2 23 06/10/2019   GLUCOSE 99 06/10/2019   BUN 11 06/10/2019   CREATININE 0.70 06/10/2019   BILITOT 0.5 06/10/2019   ALKPHOS 131 (H) 06/10/2019   AST 17 06/10/2019   ALT 10 06/10/2019   PROT 7.1 06/10/2019   ALBUMIN 4.0 06/10/2019   CALCIUM 9.7  06/10/2019   No results found for: CHOL No results found for: HDL No results found for: LDLCALC No results found for: TRIG No results found for: CHOLHDL Lab Results  Component Value Date   HGBA1C 4.9 06/10/2019      Assessment & Plan:   Problem List Items Addressed This Visit    None    Visit Diagnoses    Weight gain    -  Primary Discussed weight loss strategies Follow up in six weeks   Relevant Medications   metFORMIN (GLUCOPHAGE) 500 MG tablet   Morbid obesity (HCC)    - will try metformin with diet and exercise  Relevant Medications   metFORMIN (GLUCOPHAGE) 500 MG tablet   Anxiety and depression    - continue lexapro      Meds ordered this encounter  Medications  . metFORMIN (GLUCOPHAGE) 500 MG tablet    Sig: Take 1 tablet (500 mg total) by mouth daily with breakfast.    Dispense:  90 tablet    Refill:  0    Follow-up: No follow-ups on file.    Doristine Bosworth, MD

## 2019-10-12 NOTE — Patient Instructions (Addendum)
Try Slow Fe over the counter every other day to increase your iron levels.    Wt Readings from Last 3 Encounters:  10/12/19 255 lb (115.7 kg)  06/10/19 240 lb 9.6 oz (109.1 kg)  11/27/16 221 lb (100.2 kg)     If you have lab work done today you will be contacted with your lab results within the next 2 weeks.  If you have not heard from [place] then please contact us. The fastest way to get your results is to register for My Chart.   IF you received an x-ray today, you will receive an invoice from [facility]. Please contact [facility] at [phone] with questions or concerns regarding your invoice.   IF you received labwork today, you will receive an invoice from [place]. Please contact [facility] at [phone] with questions or concerns regarding your invoice.   Our billing staff will not be able to assist you with questions regarding bills from these companies.  You will be contacted with the lab results as soon as they are available. The fastest way to get your results is to activate your My Chart account. Instructions are located on the last page of this paperwork. If you have not heard from [place] regarding the results in 2 weeks, please contact this office.

## 2019-11-03 DIAGNOSIS — F331 Major depressive disorder, recurrent, moderate: Secondary | ICD-10-CM | POA: Diagnosis not present

## 2019-11-09 ENCOUNTER — Ambulatory Visit: Payer: BLUE CROSS/BLUE SHIELD

## 2019-11-16 DIAGNOSIS — Z01419 Encounter for gynecological examination (general) (routine) without abnormal findings: Secondary | ICD-10-CM | POA: Diagnosis not present

## 2019-11-16 DIAGNOSIS — Z113 Encounter for screening for infections with a predominantly sexual mode of transmission: Secondary | ICD-10-CM | POA: Diagnosis not present

## 2019-11-16 DIAGNOSIS — Z124 Encounter for screening for malignant neoplasm of cervix: Secondary | ICD-10-CM | POA: Diagnosis not present

## 2019-11-20 DIAGNOSIS — F331 Major depressive disorder, recurrent, moderate: Secondary | ICD-10-CM | POA: Diagnosis not present

## 2019-11-23 ENCOUNTER — Other Ambulatory Visit: Payer: Self-pay

## 2019-11-23 ENCOUNTER — Ambulatory Visit: Payer: BLUE CROSS/BLUE SHIELD | Admitting: Family Medicine

## 2019-11-23 ENCOUNTER — Encounter: Payer: Self-pay | Admitting: Family Medicine

## 2019-11-23 DIAGNOSIS — R7301 Impaired fasting glucose: Secondary | ICD-10-CM | POA: Diagnosis not present

## 2019-11-23 MED ORDER — PHENTERMINE HCL 15 MG PO CAPS: ORAL_CAPSULE | ORAL | 0 refills | Status: DC

## 2019-11-23 NOTE — Progress Notes (Signed)
Established Patient Office Visit  Subjective:  Patient ID: Meredith Price, female    DOB: Sep 21, 1995  Age: 24 y.o. MRN: [id number]  CC:  Chief Complaint  Patient presents with  . Obesity    weight management    HPI Meredith Price presents for   She states that she has been stress eating while studying for her comprehensive exam. She states that she has a lot of stress waiting for her results.  She also reports 3 deaths in the family.  She has been working with her therapist.   Wt Readings from Last 3 Encounters:  11/23/19 251 lb 3.2 oz (113.9 kg)  10/12/19 255 lb (115.7 kg)  06/10/19 240 lb 9.6 oz (109.1 kg)    Past Medical History:  Diagnosis Date  . Anxiety     Past Surgical History:  Procedure Laterality Date  . KNEE SURGERY      Family History  Problem Relation Age of Onset  . Hyperlipidemia Mother   . Hyperlipidemia Father   . Heart disease Maternal Grandmother     Social History   Socioeconomic History  . Marital status: Single    Spouse name: Not on file  . Number of children: Not on file  . Years of education: Not on file  . Highest education level: Not on file  Occupational History  . Occupation: Ship broker  Tobacco Use  . Smoking status: Never Smoker  . Smokeless tobacco: Never Used  Substance and Sexual Activity  . Alcohol use: Yes  . Drug use: Not Currently  . Sexual activity: Yes    Birth control/protection: I.U.D.    Comment: paraguard  Other Topics Concern  . Not on file  Social History Narrative  . Not on file   Social Determinants of Health   Financial Resource Strain: Low Risk   . Difficulty of Paying Living Expenses: Not hard at all  Food Insecurity: No Food Insecurity  . Worried About Charity fundraiser in the Last Year: Never true  . Ran Out of Food in the Last Year: Never true  Transportation Needs: No Transportation Needs  . Lack of Transportation (Medical): No  . Lack of Transportation (Non-Medical): No  Physical  Activity: Sufficiently Active  . Days of Exercise per Week: 4 days  . Minutes of Exercise per Session: 60 min  Stress: Stress Concern Present  . Feeling of Stress : Very much  Social Connections: Somewhat Isolated  . Frequency of Communication with Friends and Family: More than three times a week  . Frequency of Social Gatherings with Friends and Family: Once a week  . Attends Religious Services: More than 4 times per year  . Active Member of Clubs or Organizations: No  . Attends Archivist Meetings: Never  . Marital Status: Never married  Intimate Partner Violence: Not At Risk  . Fear of Current or Ex-Partner: No  . Emotionally Abused: No  . Physically Abused: No  . Sexually Abused: No    Outpatient Medications Prior to Visit  Medication Sig Dispense Refill  . buPROPion (WELLBUTRIN SR) 100 MG 12 hr tablet Take 1 tablet (100 mg total) by mouth daily. 30 tablet 0  . escitalopram (LEXAPRO) 10 MG tablet TAKE 1 TABLET(10 MG) BY MOUTH DAILY 90 tablet 1  . famotidine (PEPCID) 20 MG tablet Take 1 tablet (20 mg total) by mouth 2 (two) times daily. 60 tablet 3  . metFORMIN (GLUCOPHAGE) 500 MG tablet Take 1 tablet (500 mg total)  by mouth daily with breakfast. 90 tablet 0  . PARAGARD INTRAUTERINE COPPER IU by Intrauterine route.     No facility-administered medications prior to visit.    No Known Allergies  ROS Review of Systems Review of Systems  Constitutional: Negative for activity change, appetite change, chills and fever.  HENT: Negative for congestion, nosebleeds, trouble swallowing and voice change.   Respiratory: Negative for cough, shortness of breath and wheezing.   Gastrointestinal: Negative for diarrhea, nausea and vomiting.  Genitourinary: Negative for difficulty urinating, dysuria, flank pain and hematuria.  Musculoskeletal: Negative for back pain, joint swelling and neck pain.  Neurological: Negative for dizziness, speech difficulty, light-headedness and  numbness.  See HPI. All other review of systems negative.     Objective:    Physical Exam  BP 127/84   Pulse 89   Temp 98.6 F (37 C)   Resp 16   Ht 5\' 3"  (1.6 m)   Wt 251 lb 3.2 oz (113.9 kg)   LMP 11/08/2019   BMI 44.50 kg/m  Wt Readings from Last 3 Encounters:  11/23/19 251 lb 3.2 oz (113.9 kg)  10/12/19 255 lb (115.7 kg)  06/10/19 240 lb 9.6 oz (109.1 kg)   Physical Exam  Constitutional: Oriented to person, place, and time. Appears well-developed and well-nourished.  HENT:  Head: Normocephalic and atraumatic.  Eyes: Conjunctivae and EOM are normal.  Cardiovascular: Normal rate, regular rhythm, normal heart sounds and intact distal pulses.  No murmur heard. Pulmonary/Chest: Effort normal and breath sounds normal. No stridor. No respiratory distress. Has no wheezes.  Neurological: Is alert and oriented to person, place, and time.  Skin: Skin is warm. Capillary refill takes less than 2 seconds.  Psychiatric: Has a normal mood and affect. Behavior is normal. Judgment and thought content normal.    Health Maintenance Due  Topic Date Due  . HIV Screening  Never done  . PAP-Cervical Cytology Screening  Never done  . PAP SMEAR-Modifier  Never done    There are no preventive care reminders to display for this patient.  Lab Results  Component Value Date   TSH 1.640 06/10/2019   Lab Results  Component Value Date   WBC 10.7 06/10/2019   HGB 11.6 06/10/2019   HCT 36.0 06/10/2019   MCV 85 06/10/2019   PLT 363 06/10/2019   Lab Results  Component Value Date   NA 135 06/10/2019   K 4.4 06/10/2019   CO2 23 06/10/2019   GLUCOSE 99 06/10/2019   BUN 11 06/10/2019   CREATININE 0.70 06/10/2019   BILITOT 0.5 06/10/2019   ALKPHOS 131 (H) 06/10/2019   AST 17 06/10/2019   ALT 10 06/10/2019   PROT 7.1 06/10/2019   ALBUMIN 4.0 06/10/2019   CALCIUM 9.7 06/10/2019   No results found for: CHOL No results found for: HDL No results found for: LDLCALC No results found  for: TRIG No results found for: CHOLHDL Lab Results  Component Value Date   HGBA1C 4.9 06/10/2019      Assessment & Plan:   Problem List Items Addressed This Visit    None    Visit Diagnoses    Morbid obesity (HCC)    -  Primary   Relevant Medications   phentermine 15 MG capsule   Other Relevant Orders   Basic metabolic panel   Impaired fasting glucose       Relevant Orders   Basic metabolic panel    reviewed ECG from 06/2019 Discussed risks and benefits of phentermine  including hypertension, tachycardia, as well as sweats and UTI.  Continue exercise  Eat small frequent meals   Meds ordered this encounter  Medications  . phentermine 15 MG capsule    Sig: Take one capsule every other day for a week then increase to once daily. Drink plenty of water. BMI 44.5    Dispense:  30 capsule    Refill:  0    Follow-up: Return in about 4 weeks (around 12/21/2019) for weight check, phentermine check.    Doristine Bosworth, MD

## 2019-11-24 LAB — BASIC METABOLIC PANEL
BUN/Creatinine Ratio: 14 (ref 9–23)
BUN: 11 mg/dL (ref 6–20)
CO2: 23 mmol/L (ref 20–29)
Calcium: 9.5 mg/dL (ref 8.7–10.2)
Chloride: 102 mmol/L (ref 96–106)
Creatinine, Ser: 0.81 mg/dL (ref 0.57–1.00)
GFR calc Af Amer: 117 mL/min/{1.73_m2} (ref >59)
GFR calc non Af Amer: 101 mL/min/{1.73_m2} (ref >59)
Glucose: 100 mg/dL — ABNORMAL HIGH (ref 65–99)
Potassium: 4.7 mmol/L (ref 3.5–5.2)
Sodium: 141 mmol/L (ref 134–144)

## 2019-12-21 ENCOUNTER — Ambulatory Visit (INDEPENDENT_AMBULATORY_CARE_PROVIDER_SITE_OTHER): Payer: BLUE CROSS/BLUE SHIELD | Admitting: Family Medicine

## 2019-12-21 ENCOUNTER — Other Ambulatory Visit: Payer: Self-pay

## 2019-12-21 ENCOUNTER — Encounter: Payer: Self-pay | Admitting: Family Medicine

## 2019-12-21 DIAGNOSIS — Z114 Encounter for screening for human immunodeficiency virus [HIV]: Secondary | ICD-10-CM

## 2019-12-21 DIAGNOSIS — Z124 Encounter for screening for malignant neoplasm of cervix: Secondary | ICD-10-CM

## 2019-12-21 MED ORDER — PHENTERMINE HCL 37.5 MG PO TABS: 37.5000 mg | ORAL_TABLET | Freq: Every day | ORAL | 0 refills | Status: AC

## 2019-12-21 NOTE — Progress Notes (Signed)
Established Patient Office Visit  Subjective:  Patient ID: Meredith Price, female    DOB: [birth year]  Age: 24 y.o. MRN: [id number]  CC:  Chief Complaint  Patient presents with  . Weight Check    1 m f/u   . Medical Management of Chronic Issues    HPI Meredith Price presents for   She reports that she was not able to start phentermine  She states that with insurance the cost was $40 She states that she also switched from the wellbutrin to lexapro  She feels like it is working well for her.  She reports that this past week she was on vacation over the weekend. She states that she is back to exercising at [employer] and exercises 3-4 days a week She is drinking water, no sodas, avoid juices and friend foods. Body mass index is 45.88 kg/m.   Wt Readings from Last 3 Encounters:  12/21/19 259 lb (117.5 kg)  11/23/19 251 lb 3.2 oz (113.9 kg)  10/12/19 255 lb (115.7 kg)     Past Medical History:  Diagnosis Date  . Anxiety     Past Surgical History:  Procedure Laterality Date  . KNEE SURGERY      Family History  Problem Relation Age of Onset  . Hyperlipidemia Mother   . Hyperlipidemia Father   . Heart disease Maternal Grandmother     Social History   Socioeconomic History  . Marital status: Single    Spouse name: Not on file  . Number of children: Not on file  . Years of education: Not on file  . Highest education level: Not on file  Occupational History  . Occupation: Consulting civil engineer  Tobacco Use  . Smoking status: Never Smoker  . Smokeless tobacco: Never Used  Substance and Sexual Activity  . Alcohol use: Yes  . Drug use: Not Currently  . Sexual activity: Yes    Birth control/protection: I.U.D.    Comment: paraguard  Other Topics Concern  . Not on file  Social History Narrative  . Not on file   Social Determinants of Health   Financial Resource Strain: Low Risk   . Difficulty of Paying Living Expenses: Not hard at all  Food Insecurity: No Food  Insecurity  . Worried About Programme researcher, broadcasting/film/video in the Last Year: Never true  . Ran Out of Food in the Last Year: Never true  Transportation Needs: No Transportation Needs  . Lack of Transportation (Medical): No  . Lack of Transportation (Non-Medical): No  Physical Activity: Sufficiently Active  . Days of Exercise per Week: 4 days  . Minutes of Exercise per Session: 60 min  Stress: Stress Concern Present  . Feeling of Stress : Very much  Social Connections: Somewhat Isolated  . Frequency of Communication with Friends and Family: More than three times a week  . Frequency of Social Gatherings with Friends and Family: Once a week  . Attends Religious Services: More than 4 times per year  . Active Member of Clubs or Organizations: No  . Attends Banker Meetings: Never  . Marital Status: Never married  Intimate Partner Violence: Not At Risk  . Fear of Current or Ex-Partner: No  . Emotionally Abused: No  . Physically Abused: No  . Sexually Abused: No    Outpatient Medications Prior to Visit  Medication Sig Dispense Refill  . escitalopram (LEXAPRO) 10 MG tablet TAKE 1 TABLET(10 MG) BY MOUTH DAILY 90 tablet 1  . famotidine (  PEPCID) 20 MG tablet Take 1 tablet (20 mg total) by mouth 2 (two) times daily. 60 tablet 3  . metFORMIN (GLUCOPHAGE) 500 MG tablet Take 1 tablet (500 mg total) by mouth daily with breakfast. 90 tablet 0  . PARAGARD INTRAUTERINE COPPER IU by Intrauterine route.    Marland Kitchen buPROPion (WELLBUTRIN SR) 100 MG 12 hr tablet Take 1 tablet (100 mg total) by mouth daily. (Patient not taking: Reported on 12/21/2019) 30 tablet 0  . phentermine 15 MG capsule Take one capsule every other day for a week then increase to once daily. Drink plenty of water. BMI 44.5 30 capsule 0   No facility-administered medications prior to visit.    No Known Allergies  ROS Review of Systems Review of Systems  Constitutional: Negative for activity change, appetite change, chills and fever.    HENT: Negative for congestion, nosebleeds, trouble swallowing and voice change.   Respiratory: Negative for cough, shortness of breath and wheezing.   Gastrointestinal: Negative for diarrhea, nausea and vomiting.  Genitourinary: Negative for difficulty urinating, dysuria, flank pain and hematuria.  Musculoskeletal: Negative for back pain, joint swelling and neck pain.  Neurological: Negative for dizziness, speech difficulty, light-headedness and numbness.  See HPI. All other review of systems negative.     Objective:    Physical Exam  BP 110/81   Pulse 95   Temp 98.1 F (36.7 C) (Temporal)   Ht 5\' 3"  (1.6 m)   Wt 259 lb (117.5 kg)   LMP 11/08/2019   SpO2 94%   BMI 45.88 kg/m  Wt Readings from Last 3 Encounters:  12/21/19 259 lb (117.5 kg)  11/23/19 251 lb 3.2 oz (113.9 kg)  10/12/19 255 lb (115.7 kg)   Physical Exam  Constitutional: Oriented to person, place, and time. Appears well-developed and well-nourished.  HENT:  Head: Normocephalic and atraumatic.  Eyes: Conjunctivae and EOM are normal.  Cardiovascular: Normal rate, regular rhythm, normal heart sounds and intact distal pulses.  No murmur heard. Pulmonary/Chest: Effort normal and breath sounds normal. No stridor. No respiratory distress. Has no wheezes.  Neurological: Is alert and oriented to person, place, and time.  Skin: Skin is warm. Capillary refill takes less than 2 seconds.  Psychiatric: Has a normal mood and affect. Behavior is normal. Judgment and thought content normal.    Health Maintenance Due  Topic Date Due  . HIV Screening  Never done  . COVID-19 Vaccine (1) Never done  . PAP-Cervical Cytology Screening  Never done  . PAP SMEAR-Modifier  Never done    There are no preventive care reminders to display for this patient.  Lab Results  Component Value Date   TSH 1.640 06/10/2019   Lab Results  Component Value Date   WBC 10.7 06/10/2019   HGB 11.6 06/10/2019   HCT 36.0 06/10/2019   MCV 85  06/10/2019   PLT 363 06/10/2019   Lab Results  Component Value Date   NA 141 11/23/2019   K 4.7 11/23/2019   CO2 23 11/23/2019   GLUCOSE 100 (H) 11/23/2019   BUN 11 11/23/2019   CREATININE 0.81 11/23/2019   BILITOT 0.5 06/10/2019   ALKPHOS 131 (H) 06/10/2019   AST 17 06/10/2019   ALT 10 06/10/2019   PROT 7.1 06/10/2019   ALBUMIN 4.0 06/10/2019   CALCIUM 9.5 11/23/2019   No results found for: CHOL No results found for: HDL No results found for: LDLCALC No results found for: TRIG No results found for: [facility] Lab Results  Component  Value Date   HGBA1C 4.9 06/10/2019      Assessment & Plan:   Problem List Items Addressed This Visit    None    Visit Diagnoses    Morbid obesity (HCC)    -  Primary   Relevant Medications   phentermine (ADIPEX-P) 37.5 MG tablet    continue exercise Improve dietary compliance Start phentermine Continue lexapro Establish with [facility] in June  Meds ordered this encounter  Medications  . phentermine (ADIPEX-P) 37.5 MG tablet    Sig: Take 1 tablet (37.5 mg total) by mouth daily before breakfast. BMI 45.88    Dispense:  30 tablet    Refill:  0    Follow-up: Return in about 2 months (around 02/20/2020) for weight check.    Doristine Bosworth, MD

## 2019-12-21 NOTE — Patient Instructions (Signed)
° ° ° °  If you have lab work done today you will be contacted with your lab results within the next 2 weeks.  If you have not heard from us then please contact us. The fastest way to get your results is to register for My Chart. ° ° °IF you received an x-ray today, you will receive an invoice from [facility]. Please contact [facility] at [phone] with questions or concerns regarding your invoice.  ° °IF you received labwork today, you will receive an invoice from [facility]. Please contact [facility] at [phone] with questions or concerns regarding your invoice.  ° °Our billing staff will not be able to assist you with questions regarding bills from these companies. ° °You will be contacted with the lab results as soon as they are available. The fastest way to get your results is to activate your My Chart account. Instructions are located on the last page of this paperwork. If you have not heard from us regarding the results in 2 weeks, please contact this office. °  ° ° ° °

## 2020-01-07 ENCOUNTER — Other Ambulatory Visit: Payer: Self-pay | Admitting: Family Medicine

## 2020-01-07 DIAGNOSIS — R635 Abnormal weight gain: Secondary | ICD-10-CM

## 2020-01-12 DIAGNOSIS — F331 Major depressive disorder, recurrent, moderate: Secondary | ICD-10-CM | POA: Diagnosis not present

## 2020-02-15 DIAGNOSIS — N939 Abnormal uterine and vaginal bleeding, unspecified: Secondary | ICD-10-CM | POA: Diagnosis not present

## 2020-02-19 DIAGNOSIS — Z30431 Encounter for routine checking of intrauterine contraceptive device: Secondary | ICD-10-CM | POA: Diagnosis not present

## 2020-02-19 DIAGNOSIS — Z30432 Encounter for removal of intrauterine contraceptive device: Secondary | ICD-10-CM | POA: Diagnosis not present

## 2020-02-19 DIAGNOSIS — Z30011 Encounter for initial prescription of contraceptive pills: Secondary | ICD-10-CM | POA: Diagnosis not present
